# Patient Record
Sex: Male | Born: 1950 | Race: White | Hispanic: No | Smoking: Former smoker
Health system: Southern US, Community
[De-identification: ages and names within clinical notes are randomized; demographics above are authoritative.]

## PROBLEM LIST (undated history)

## (undated) DIAGNOSIS — F32A Depression, unspecified: Secondary | ICD-10-CM

## (undated) DIAGNOSIS — J449 Chronic obstructive pulmonary disease, unspecified: Secondary | ICD-10-CM

## (undated) DIAGNOSIS — F329 Major depressive disorder, single episode, unspecified: Secondary | ICD-10-CM

## (undated) DIAGNOSIS — F419 Anxiety disorder, unspecified: Secondary | ICD-10-CM

## (undated) HISTORY — PX: NASAL SEPTUM SURGERY: SHX37

## (undated) HISTORY — PX: REPLACEMENT TOTAL KNEE: SUR1224

---

## 1998-07-11 ENCOUNTER — Ambulatory Visit: Admission: RE | Admit: 1998-07-11 | Discharge: 1998-07-11 | Payer: Self-pay | Admitting: Internal Medicine

## 1998-08-30 ENCOUNTER — Ambulatory Visit: Admission: RE | Admit: 1998-08-30 | Discharge: 1998-08-30 | Payer: Self-pay | Admitting: Internal Medicine

## 1999-12-24 ENCOUNTER — Encounter: Payer: Self-pay | Admitting: *Deleted

## 1999-12-24 ENCOUNTER — Ambulatory Visit (HOSPITAL_COMMUNITY): Admission: RE | Admit: 1999-12-24 | Discharge: 1999-12-24 | Payer: Self-pay | Admitting: *Deleted

## 2000-01-28 ENCOUNTER — Ambulatory Visit (HOSPITAL_BASED_OUTPATIENT_CLINIC_OR_DEPARTMENT_OTHER): Admission: RE | Admit: 2000-01-28 | Discharge: 2000-01-29 | Payer: Self-pay | Admitting: Orthopedic Surgery

## 2000-11-29 ENCOUNTER — Encounter: Payer: Self-pay | Admitting: Emergency Medicine

## 2000-11-29 ENCOUNTER — Emergency Department (HOSPITAL_COMMUNITY): Admission: EM | Admit: 2000-11-29 | Discharge: 2000-11-29 | Payer: Self-pay | Admitting: Emergency Medicine

## 2001-02-25 ENCOUNTER — Ambulatory Visit (HOSPITAL_COMMUNITY): Admission: RE | Admit: 2001-02-25 | Discharge: 2001-02-25 | Payer: Self-pay | Admitting: Neurology

## 2003-12-28 ENCOUNTER — Inpatient Hospital Stay (HOSPITAL_COMMUNITY): Admission: EM | Admit: 2003-12-28 | Discharge: 2003-12-30 | Payer: Self-pay | Admitting: Emergency Medicine

## 2004-01-04 ENCOUNTER — Encounter: Admission: RE | Admit: 2004-01-04 | Discharge: 2004-01-04 | Payer: Self-pay | Admitting: Family Medicine

## 2004-01-14 ENCOUNTER — Encounter: Admission: RE | Admit: 2004-01-14 | Discharge: 2004-01-14 | Payer: Self-pay | Admitting: Family Medicine

## 2004-02-18 ENCOUNTER — Encounter: Admission: RE | Admit: 2004-02-18 | Discharge: 2004-02-18 | Payer: Self-pay | Admitting: Family Medicine

## 2004-02-19 ENCOUNTER — Inpatient Hospital Stay (HOSPITAL_COMMUNITY): Admission: EM | Admit: 2004-02-19 | Discharge: 2004-03-17 | Payer: Self-pay | Admitting: Emergency Medicine

## 2004-02-19 ENCOUNTER — Encounter: Payer: Self-pay | Admitting: Cardiovascular Disease

## 2004-03-17 ENCOUNTER — Inpatient Hospital Stay (HOSPITAL_COMMUNITY)
Admission: RE | Admit: 2004-03-17 | Discharge: 2004-03-21 | Payer: Self-pay | Admitting: Physical Medicine & Rehabilitation

## 2004-03-21 ENCOUNTER — Inpatient Hospital Stay (HOSPITAL_COMMUNITY): Admission: RE | Admit: 2004-03-21 | Discharge: 2004-04-02 | Payer: Self-pay | Admitting: Psychiatry

## 2004-04-03 ENCOUNTER — Other Ambulatory Visit (HOSPITAL_COMMUNITY): Admission: RE | Admit: 2004-04-03 | Discharge: 2004-05-05 | Payer: Self-pay | Admitting: Psychiatry

## 2004-04-07 ENCOUNTER — Encounter: Admission: RE | Admit: 2004-04-07 | Discharge: 2004-04-07 | Payer: Self-pay | Admitting: Family Medicine

## 2004-04-10 ENCOUNTER — Encounter: Admission: RE | Admit: 2004-04-10 | Discharge: 2004-04-10 | Payer: Self-pay | Admitting: Sports Medicine

## 2004-04-24 ENCOUNTER — Encounter: Admission: RE | Admit: 2004-04-24 | Discharge: 2004-04-24 | Payer: Self-pay | Admitting: Sports Medicine

## 2004-05-01 ENCOUNTER — Encounter: Admission: RE | Admit: 2004-05-01 | Discharge: 2004-05-22 | Payer: Self-pay | Admitting: Sports Medicine

## 2004-05-29 ENCOUNTER — Encounter: Admission: RE | Admit: 2004-05-29 | Discharge: 2004-05-29 | Payer: Self-pay | Admitting: Family Medicine

## 2004-05-30 ENCOUNTER — Encounter: Admission: RE | Admit: 2004-05-30 | Discharge: 2004-05-30 | Payer: Self-pay | Admitting: Family Medicine

## 2004-06-30 ENCOUNTER — Encounter: Admission: RE | Admit: 2004-06-30 | Discharge: 2004-06-30 | Payer: Self-pay | Admitting: Family Medicine

## 2004-07-23 ENCOUNTER — Encounter: Admission: RE | Admit: 2004-07-23 | Discharge: 2004-07-23 | Payer: Self-pay | Admitting: Family Medicine

## 2004-08-07 ENCOUNTER — Ambulatory Visit: Payer: Self-pay | Admitting: Family Medicine

## 2004-08-21 ENCOUNTER — Ambulatory Visit: Payer: Self-pay | Admitting: Sports Medicine

## 2004-09-04 ENCOUNTER — Ambulatory Visit: Payer: Self-pay | Admitting: Family Medicine

## 2005-01-21 ENCOUNTER — Ambulatory Visit: Payer: Self-pay | Admitting: Family Medicine

## 2005-02-25 ENCOUNTER — Ambulatory Visit: Payer: Self-pay | Admitting: Family Medicine

## 2005-03-16 ENCOUNTER — Ambulatory Visit: Payer: Self-pay | Admitting: Sports Medicine

## 2005-04-15 ENCOUNTER — Ambulatory Visit: Payer: Self-pay | Admitting: Family Medicine

## 2005-08-16 ENCOUNTER — Ambulatory Visit: Payer: Self-pay | Admitting: Family Medicine

## 2005-08-16 ENCOUNTER — Inpatient Hospital Stay (HOSPITAL_COMMUNITY): Admission: EM | Admit: 2005-08-16 | Discharge: 2005-08-17 | Payer: Self-pay | Admitting: *Deleted

## 2005-08-21 ENCOUNTER — Ambulatory Visit: Payer: Self-pay | Admitting: Family Medicine

## 2005-10-06 ENCOUNTER — Ambulatory Visit: Payer: Self-pay | Admitting: Family Medicine

## 2006-02-22 ENCOUNTER — Ambulatory Visit: Payer: Self-pay | Admitting: Sports Medicine

## 2006-08-12 ENCOUNTER — Ambulatory Visit: Payer: Self-pay | Admitting: Sports Medicine

## 2006-08-27 ENCOUNTER — Inpatient Hospital Stay (HOSPITAL_COMMUNITY): Admission: RE | Admit: 2006-08-27 | Discharge: 2006-09-02 | Payer: Self-pay | Admitting: Orthopedic Surgery

## 2006-10-06 ENCOUNTER — Ambulatory Visit: Payer: Self-pay | Admitting: Family Medicine

## 2006-10-27 ENCOUNTER — Emergency Department (HOSPITAL_COMMUNITY): Admission: EM | Admit: 2006-10-27 | Discharge: 2006-10-28 | Payer: Self-pay | Admitting: Emergency Medicine

## 2006-11-04 ENCOUNTER — Ambulatory Visit: Payer: Self-pay | Admitting: Family Medicine

## 2006-11-09 ENCOUNTER — Ambulatory Visit: Payer: Self-pay | Admitting: Sports Medicine

## 2006-12-02 ENCOUNTER — Ambulatory Visit: Payer: Self-pay | Admitting: Family Medicine

## 2006-12-02 ENCOUNTER — Encounter (INDEPENDENT_AMBULATORY_CARE_PROVIDER_SITE_OTHER): Payer: Self-pay | Admitting: Family Medicine

## 2006-12-02 LAB — CONVERTED CEMR LAB
ALT: 20 units/L (ref 0–53)
Alkaline Phosphatase: 105 units/L (ref 39–117)
Bilirubin, Direct: 0.1 mg/dL (ref 0.0–0.3)
Cholesterol: 321 mg/dL — ABNORMAL HIGH (ref 0–200)
Indirect Bilirubin: 0.3 mg/dL (ref 0.0–0.9)
Total Protein: 7.2 g/dL (ref 6.0–8.3)
Triglycerides: 332 mg/dL — ABNORMAL HIGH (ref <150)
VLDL: 66 mg/dL — ABNORMAL HIGH (ref 0–40)

## 2007-01-06 ENCOUNTER — Ambulatory Visit: Payer: Self-pay | Admitting: Family Medicine

## 2007-01-20 DIAGNOSIS — G4730 Sleep apnea, unspecified: Secondary | ICD-10-CM | POA: Insufficient documentation

## 2007-01-20 DIAGNOSIS — J4489 Other specified chronic obstructive pulmonary disease: Secondary | ICD-10-CM | POA: Insufficient documentation

## 2007-01-20 DIAGNOSIS — F411 Generalized anxiety disorder: Secondary | ICD-10-CM | POA: Insufficient documentation

## 2007-01-20 DIAGNOSIS — K219 Gastro-esophageal reflux disease without esophagitis: Secondary | ICD-10-CM

## 2007-01-20 DIAGNOSIS — J309 Allergic rhinitis, unspecified: Secondary | ICD-10-CM | POA: Insufficient documentation

## 2007-01-20 DIAGNOSIS — J449 Chronic obstructive pulmonary disease, unspecified: Secondary | ICD-10-CM

## 2007-01-20 DIAGNOSIS — I10 Essential (primary) hypertension: Secondary | ICD-10-CM

## 2007-01-20 DIAGNOSIS — F339 Major depressive disorder, recurrent, unspecified: Secondary | ICD-10-CM | POA: Insufficient documentation

## 2007-01-20 DIAGNOSIS — N400 Enlarged prostate without lower urinary tract symptoms: Secondary | ICD-10-CM | POA: Insufficient documentation

## 2007-01-20 DIAGNOSIS — E785 Hyperlipidemia, unspecified: Secondary | ICD-10-CM

## 2007-01-31 ENCOUNTER — Telehealth: Payer: Self-pay | Admitting: *Deleted

## 2007-02-01 ENCOUNTER — Ambulatory Visit: Payer: Self-pay | Admitting: Family Medicine

## 2007-02-02 ENCOUNTER — Telehealth: Payer: Self-pay | Admitting: *Deleted

## 2007-03-09 ENCOUNTER — Encounter (INDEPENDENT_AMBULATORY_CARE_PROVIDER_SITE_OTHER): Payer: Self-pay | Admitting: Family Medicine

## 2007-03-09 ENCOUNTER — Ambulatory Visit: Payer: Self-pay | Admitting: Family Medicine

## 2007-03-09 LAB — CONVERTED CEMR LAB
AST: 18 units/L (ref 0–37)
Albumin: 4.2 g/dL (ref 3.5–5.2)
Alkaline Phosphatase: 102 units/L (ref 39–117)
BUN: 30 mg/dL — ABNORMAL HIGH (ref 6–23)
Calcium: 9.5 mg/dL (ref 8.4–10.5)
Chloride: 97 meq/L (ref 96–112)
Creatinine, Ser: 1.22 mg/dL (ref 0.40–1.50)
Glucose, Bld: 103 mg/dL — ABNORMAL HIGH (ref 70–99)
HDL: 40 mg/dL (ref >39)
Potassium: 3.8 meq/L (ref 3.5–5.3)
Total CHOL/HDL Ratio: 4.5
Triglycerides: 271 mg/dL — ABNORMAL HIGH (ref <150)

## 2007-03-23 ENCOUNTER — Telehealth (INDEPENDENT_AMBULATORY_CARE_PROVIDER_SITE_OTHER): Payer: Self-pay | Admitting: Pharmacist

## 2007-05-31 ENCOUNTER — Telehealth (INDEPENDENT_AMBULATORY_CARE_PROVIDER_SITE_OTHER): Payer: Self-pay | Admitting: Family Medicine

## 2007-06-06 ENCOUNTER — Telehealth (INDEPENDENT_AMBULATORY_CARE_PROVIDER_SITE_OTHER): Payer: Self-pay | Admitting: Pharmacist

## 2007-06-07 ENCOUNTER — Ambulatory Visit: Payer: Self-pay | Admitting: Family Medicine

## 2007-07-08 ENCOUNTER — Encounter (INDEPENDENT_AMBULATORY_CARE_PROVIDER_SITE_OTHER): Payer: Self-pay | Admitting: Family Medicine

## 2007-07-08 ENCOUNTER — Ambulatory Visit: Payer: Self-pay | Admitting: Family Medicine

## 2008-11-02 ENCOUNTER — Encounter: Payer: Self-pay | Admitting: Emergency Medicine

## 2008-11-02 ENCOUNTER — Inpatient Hospital Stay (HOSPITAL_COMMUNITY): Admission: AD | Admit: 2008-11-02 | Discharge: 2008-11-06 | Payer: Self-pay | Admitting: Family Medicine

## 2008-11-02 ENCOUNTER — Ambulatory Visit: Payer: Self-pay | Admitting: Family Medicine

## 2008-11-02 ENCOUNTER — Encounter: Payer: Self-pay | Admitting: Family Medicine

## 2008-11-02 DIAGNOSIS — T50901A Poisoning by unspecified drugs, medicaments and biological substances, accidental (unintentional), initial encounter: Secondary | ICD-10-CM | POA: Insufficient documentation

## 2008-11-06 ENCOUNTER — Ambulatory Visit: Payer: Self-pay | Admitting: Psychiatry

## 2008-11-06 ENCOUNTER — Inpatient Hospital Stay (HOSPITAL_COMMUNITY): Admission: RE | Admit: 2008-11-06 | Discharge: 2008-11-14 | Payer: Self-pay | Admitting: Psychiatry

## 2008-11-19 ENCOUNTER — Other Ambulatory Visit (HOSPITAL_COMMUNITY): Admission: RE | Admit: 2008-11-19 | Discharge: 2008-12-04 | Payer: Self-pay | Admitting: Psychiatry

## 2008-11-20 ENCOUNTER — Ambulatory Visit: Payer: Self-pay | Admitting: Family Medicine

## 2008-11-20 ENCOUNTER — Encounter: Payer: Self-pay | Admitting: Family Medicine

## 2008-11-20 DIAGNOSIS — IMO0001 Reserved for inherently not codable concepts without codable children: Secondary | ICD-10-CM

## 2008-11-20 LAB — CONVERTED CEMR LAB
BUN: 15 mg/dL (ref 6–23)
CO2: 23 meq/L (ref 19–32)
Calcium: 8.9 mg/dL (ref 8.4–10.5)
Chloride: 104 meq/L (ref 96–112)
Creatinine, Ser: 1.02 mg/dL (ref 0.40–1.50)
Direct LDL: 191 mg/dL — ABNORMAL HIGH
PSA: 2.99 ng/mL (ref 0.10–4.00)
Total Bilirubin: 0.4 mg/dL (ref 0.3–1.2)

## 2008-12-06 ENCOUNTER — Telehealth: Payer: Self-pay | Admitting: Family Medicine

## 2009-07-17 ENCOUNTER — Telehealth (INDEPENDENT_AMBULATORY_CARE_PROVIDER_SITE_OTHER): Payer: Self-pay | Admitting: *Deleted

## 2009-08-02 ENCOUNTER — Ambulatory Visit: Payer: Self-pay | Admitting: Family Medicine

## 2009-08-02 DIAGNOSIS — F17200 Nicotine dependence, unspecified, uncomplicated: Secondary | ICD-10-CM

## 2009-11-05 ENCOUNTER — Telehealth (INDEPENDENT_AMBULATORY_CARE_PROVIDER_SITE_OTHER): Payer: Self-pay | Admitting: *Deleted

## 2009-11-05 ENCOUNTER — Ambulatory Visit: Payer: Self-pay | Admitting: Family Medicine

## 2009-11-06 ENCOUNTER — Encounter: Payer: Self-pay | Admitting: Family Medicine

## 2009-11-06 ENCOUNTER — Ambulatory Visit: Payer: Self-pay | Admitting: Family Medicine

## 2009-11-06 ENCOUNTER — Inpatient Hospital Stay (HOSPITAL_COMMUNITY): Admission: EM | Admit: 2009-11-06 | Discharge: 2009-11-08 | Payer: Self-pay | Admitting: Emergency Medicine

## 2009-11-06 ENCOUNTER — Encounter: Admission: RE | Admit: 2009-11-06 | Discharge: 2009-11-06 | Payer: Self-pay | Admitting: Family Medicine

## 2009-11-25 ENCOUNTER — Ambulatory Visit: Payer: Self-pay | Admitting: Family Medicine

## 2009-11-25 LAB — CONVERTED CEMR LAB: Rapid Strep: NEGATIVE

## 2010-10-15 ENCOUNTER — Encounter (INDEPENDENT_AMBULATORY_CARE_PROVIDER_SITE_OTHER): Payer: Self-pay | Admitting: *Deleted

## 2010-10-28 ENCOUNTER — Encounter: Payer: Self-pay | Admitting: Family Medicine

## 2010-12-23 NOTE — Assessment & Plan Note (Signed)
Summary: hfu,df   Vital Signs:  Patient profile:   60 year old male Height:      69 inches Weight:      186 pounds BMI:     27.57 Temp:     98.7 degrees F oral Pulse rate:   82 / minute BP sitting:   129 / 83  (left arm) Cuff size:   regular  Vitals Entered By: Tessie Fass CMA (November 25, 2009 2:14 PM) CC: hospital f/u pneumonia Is Patient Diabetic? No Pain Assessment Patient in pain? yes     Location: chest Intensity: 2   Primary Care Provider:  [name] MD  CC:  hospital f/u pneumonia.  History of Present Illness: Mr. [name] is here for hospital follow-up.  Was hospitalized for pneumonia about 2 weeks ago.  Treated with Avelox.  BP med also stopped due to low normal BPs.  Reports he is feeling better and better everyday though still not quite back to normal.  Still has annoying scrathcy cough and sore throat.  Worried he has strep throat.  No other complaints today.   Habits & Providers  Alcohol-Tobacco-Diet     Tobacco Status: quit  Allergies: No Known Drug Allergies  Social History: Smoking Status:  quit  Physical Exam  General:  alert, well-developed, well-nourished, and well-hydrated.   Mouth:  oropharynx mildly erythematous Lungs:  normal work of breathing, slightly decreased BS on R base.  No wheezes, crackles. Heart:  RRR without murmur   Impression & Recommendations:  Problem # 1:  PNEUMONIA (ICD-486) Assessment Improved  Clinically resolving.  Follow-up in 1 month for repeat CXR to eval for resolution.  Completed  course of avelox.   Orders: [facility]- Est Level  3 ([id number])  Problem # 2:  HYPERTENSION, BENIGN SYSTEMIC (ICD-401.1) Assessment: Improved  Currently off lisinopril and BP normal.  Will recheck in 1 month.  His updated medication list for this problem includes:    Lisinopril 40 Mg Tabs (Lisinopril) .Marland Kitchen... 1 tab by mouth daily  Orders: [facility]- Est Level  3 ([id number])  Complete Medication List: 1)  Clonazepam 1 Mg Tabs  (Clonazepam) .... Take as directed 2)  Aspir-low 81 Mg Tbec (Aspirin) .Marland Kitchen.. 1 daily 3)  Loratadine 10 Mg Tabs (Loratadine) .Marland Kitchen.. 1 tab by mouth daily prn 4)  Combivent 103-18 Mcg/act Aero (Ipratropium-albuterol) .... 2 puffs inhaled twice a day 5)  Lisinopril 40 Mg Tabs (Lisinopril) .Marland Kitchen.. 1 tab by mouth daily 6)  Lithium Carbonate 300 Mg Caps (Lithium carbonate) .Marland Kitchen.. 1 cap by mouth two times a day 7)  Remeron 45 Mg Tabs (Mirtazapine) .Marland Kitchen.. 1 tab by mouth at bedtime 8)  Fluoxetine Hcl 20 Mg Tabs (Fluoxetine hcl) .Marland Kitchen.. 1 tab by mouth daily 9)  Tramadol Hcl 50 Mg Tabs (Tramadol hcl) .Marland Kitchen.. 1-2 tabs by mouth every 6 hours as needed for pain 10)  Pravastatin Sodium 20 Mg Tabs (Pravastatin sodium) .Marland Kitchen.. 1 tab by mouth at bedtime 11)  Chantix Starting Month Pak 0.5 Mg X 11 & 1 Mg X 42 Tabs (Varenicline tartrate) .... As directed - dispense one month supply 12)  Chantix 1 Mg Tabs (Varenicline tartrate) .... One twice daily with meals  Other Orders: Rapid Strep-[facility] ([id number])  Laboratory Results  Date/Time Received: November 25, 2009 3:23 PM  Date/Time Reported: November 25, 2009 3:43 PM   Other Tests  Rapid Strep: negative Comments: ...............test performed by......Marland KitchenBonnie A. [place], MLS (ASCP)cm

## 2010-12-23 NOTE — Miscellaneous (Signed)
  Clinical Lists Changes  Problems: Changed problem from APNEA, SLEEP - ON CPAP (ICD-780.57) to SLEEP APNEA (ICD-780.57) Removed problem of PNEUMONIA (ICD-486) Removed problem of SPECIAL SCREENING MALIGNANT NEOPLASM OF PROSTATE (ICD-V76.44)      Allergies: No Known Drug Allergies

## 2010-12-23 NOTE — Miscellaneous (Signed)
Summary: med. re request  Clinical Lists Changes   Recieved med record request: [employer]  Date sent- 10/10/2010  Marily Memos  October 15, 2010 1:41 PM

## 2011-01-01 ENCOUNTER — Telehealth: Payer: Self-pay | Admitting: *Deleted

## 2011-01-01 NOTE — Telephone Encounter (Signed)
Message copied by Arlyss Repress on Thu Jan 01, 2011  2:42 PM ------      Message from: [place], [place]      Created: Thu Jan 01, 2011  2:24 PM      Regarding: needs appt prior to refill       Pt needs an appt prior to giving refills has not been seen at clinic in over 1 year.  CAVINESS,DAWN

## 2011-02-23 LAB — EXPECTORATED SPUTUM ASSESSMENT W GRAM STAIN, RFLX TO RESP C

## 2011-02-23 LAB — BASIC METABOLIC PANEL
CO2: 28 mEq/L (ref 19–32)
Calcium: 8 mg/dL — ABNORMAL LOW (ref 8.4–10.5)
Chloride: 106 mEq/L (ref 96–112)
Creatinine, Ser: 0.99 mg/dL (ref 0.4–1.5)
GFR calc non Af Amer: >60 mL/min (ref >60)
Glucose, Bld: 93 mg/dL (ref 70–99)
Potassium: 4.1 mEq/L (ref 3.5–5.1)
Sodium: 135 mEq/L (ref 135–145)

## 2011-02-23 LAB — CBC
HCT: 38.7 % — ABNORMAL LOW (ref 39.0–52.0)
MCHC: 33.6 g/dL (ref 30.0–36.0)
MCHC: 34.3 g/dL (ref 30.0–36.0)
MCV: 87.1 fL (ref 78.0–100.0)
Platelets: 309 10*3/uL (ref 150–400)
RBC: 4.45 MIL/uL (ref 4.22–5.81)
RBC: 4.81 MIL/uL (ref 4.22–5.81)
WBC: 11.4 10*3/uL — ABNORMAL HIGH (ref 4.0–10.5)
WBC: 13.5 10*3/uL — ABNORMAL HIGH (ref 4.0–10.5)

## 2011-02-23 LAB — CULTURE, RESPIRATORY W GRAM STAIN

## 2011-02-24 LAB — BASIC METABOLIC PANEL
CO2: 24 mEq/L (ref 19–32)
Calcium: 8.7 mg/dL (ref 8.4–10.5)
Creatinine, Ser: 1.1 mg/dL (ref 0.4–1.5)
Glucose, Bld: 76 mg/dL (ref 70–99)

## 2011-02-24 LAB — CBC
MCHC: 34 g/dL (ref 30.0–36.0)
MCV: 86.7 fL (ref 78.0–100.0)
Platelets: 331 10*3/uL (ref 150–400)

## 2011-02-24 LAB — DIFFERENTIAL
Basophils Absolute: 0.1 10*3/uL (ref 0.0–0.1)
Basophils Relative: 1 % (ref 0–1)
Eosinophils Absolute: 0.7 10*3/uL (ref 0.0–0.7)
Neutrophils Relative %: 67 % (ref 43–77)

## 2011-02-24 LAB — CULTURE, BLOOD (ROUTINE X 2)

## 2011-04-07 NOTE — Discharge Summary (Signed)
[name], [name] NO.:  [id number]   MEDICAL RECORD NO.:  [id number]          PATIENT TYPE:  INP   LOCATION:  3017                         FACILITY:  [facility]   PHYSICIAN:  Dylan Ramp, MD        DATE OF BIRTH:  11-Mar-1951   DATE OF ADMISSION:  11/02/2008  DATE OF DISCHARGE:  11/05/2008                               DISCHARGE SUMMARY   PRIMARY CARE [name]:  [name], M.D. at [facility] who the patient has not seen in over for year.   DISCHARGE DIAGNOSES:  1. Intentional drug overdose  2. Hypertension.  3. Hyperlipidemia.  4. History of chronic obstructive pulmonary disease and sleep apnea      previously on the continuous positive airway pressure machine at      home, currently not requiring treatment.  5. History of major depression.  6. History of anxiety.  7. History of gastroesophageal reflux disease.  8. History of migraine.  9. History of allergic rhinitis.  10.History of benign prostatic hypertrophy.  11.History of knee injury.   PROCEDURES:  1. Portable chest x-ray on November 02, 2008 showed ill-defined left      lateral air space disease likely representing atelectasis,      borderline cardiomegaly without failure and scoliosis.  No acute      cardiopulmonary disease.  2. CT of head without contrast on November 02, 2008 showed no acute      intracranial abnormality and punctate lacunar infarct at the      anterior limb of the left internal capsule which was not present in      2005, but does not appear acute.   CONSULTATIONS:  [name], M.D. with psychiatry was consulted on  November 05, 2008.  Once the patient awoke he diagnosed the patient with  a mood disorder NOS and recommended transfer to inpatient psychiatric  ward as soon as possible.  See his STAT dictation number [id number] for  further details.   BRIEF HOSPITAL COURSE:  This is a 60 year old white male who has not  been seen in our office in August of  2008 with a history of depression  and anxiety who presented to the [facility] ED on the morning of  November 02, 2008 after overdosing on 44 Klonopin, approximately 10  Vicodin and approximately a dozen Abilify and Effexor on the night of  November 01, 2008 around 11 p.m.  He had a lengthy suicide note which he  had sent to his lawyer, who promptly called the police and sent them to  his house who discovered the patient and called 9-1-1.  The patient was  thus brought to [facility] ED by EMS.  The patient was transferred from  [facility] ED to [facility] for admission given the family  practice is his primary care doctor.  The patient arrived to [facility] approximately at 7 p.m. on November 02, 2008.   1. Intentional overdose:  The patient intended to take his life with      his essential overdose and had  a suicide note which he left was      very extensive detailing his will, as well as confessions.  The      patient will upon arrival to [facility] on November 02, 2008 was      minimally responsive even to painful stimuli.  His blood gas at      that time showed an acidotic pH with CO2 retention, as well as an      elevated bicarb as the patient was placed on BiPAP and remained on      BiPAP until early in the morning of discharge.  The patient was      given supportive care for his multidrug overdose and poison control      was contacted multiple times during admission and updated on the      patient's progress.  Particularly, the patient was monitored for      respiratory status given the overdose of benzodiazepines and      opiates, as well as monitored for cardiac and seizure sequelae of      the overdose on Effexor and Abilify.  The patient's vital signs      remained fairly stable throughout hospitalization.  He initially      had a slightly prolonged QTC on EKG in the 470s however, this      normalized by the time of discharge to approximately 440.  The       patient never had a prolonged QRS interval.  The patient also never      showed any evidence of seizure activity.  After awakening on the      morning of November 05, 2008 a repeat blood gas was obtained off of      BiPAP and indicated the patient was adequately compensating on room      air with a pH of 7.446, pCO2 of 43, pO2 of 66 and bicarb of 29 and      oxygen saturation of 94.9% on room air.  At the time of awakening      the patient did confirm that he intended to take his life and still      would like to do so.  He agrees to inpatient psych admissions for      further help.  His previous home psych meds included Klonopin 1 mg      p.o. b.i.d., Effexor 150 mg p.o. b.i.d., Abilify unknown dose and      Seroquel 200 mg p.o. nightly.  All of these meds have been held at      this time and further treatment of the patient's mood disorder will      be left to the discretion of behavioral health in the inpatient      setting.  2. History of hypertension:  The patient was initially normal to      mildly hypotensive on admission and bradycardic.  Thus no blood      pressure medicines were initiated.  He did require one dose of      p.r.n. hydralazine for elevated blood pressure thus, he will be      restarted on his home blood pressure medicine of      hydrochlorothiazide 25 mg p.o. daily at the time of discharge.  3. Hyperlipidemia:  The patient had not had a fasting lipid panel      checked in some time as he has not been seen in our office for more  than 1 year.  He reports being unable to afford his previous      statin.  Thus we will hold on restarting this medication at this      time as acute psychiatric treatment is more urgent.  We will leave      further management of this to his primary care doctor in the      outpatient setting who he will need to see within a couple weeks of      discharge from behavioral health.  4. History of COPD and sleep apnea:  The patient  required no breathing      treatments during hospitalization.  Should he have problems with      this at behavioral health we are recommending resuming CPAP      nightly.  5. History of esophageal reflux disease:  We recommend the patient      take Prilosec OTC 20 mg p.o. daily.  6. History of migraine:  The patient did not require any treatment for      this and was not on any medications for this the last time he was      seen in our office  7. History of tobacco abuse:  The patient was not treated with      nicotine patch during this hospitalization given his acute      condition related to his overdose however, would consider starting      the patient if he has cravings while at behavioral health.   PERTINENT LABORATORY DATA:  On the day of discharge the patient's basic  metabolic panel revealed a sodium of 138, potassium of 3.6, chloride of  103, bicarb of 31, glucose of 101, BUN of 4, creatinine of 0.83 and  calcium of 8.7.  Magnesium was normal at 2.  ABG was as stated above.  Cardiac panel was normal with a CK of 116, CK-MB of 0.5 and troponin of  0.01.  CBC at the time of admission revealed a white blood cell count of  11.3, hemoglobin of 16 and platelets of 234.  Urine drug screen on  admission was positive only for benzodiazepines and opiates consistent  with the patient's known overdose.  Acetaminophen and salicylate levels  were normal.  Alcohol level was less than 5 and urine tricyclic screen  was negative.   DISCHARGE MEDICATIONS:  1. Hydrochlorothiazide 25 mg p.o. daily, this is being resumed at the      time of discharge and the patient will need lab follow-up in      approximately 2 weeks with his primary care physician.  2. Prilosec OTC 20 mg p.o. daily.  3. Aspirin 81 mg p.o. daily.   Previous home medications which were not initiated the time of discharge  include:  1. Albuterol 90 mcg inhaler 2 puffs every 4 hours as needed.  2. Combivent 103/18 mcg 2 puffs  using inhaler four times a day.  3. Psych meds as listed above.  4. Crestor 40 mg p.o. daily.  5. Nasonex 50 mcg 2 sprays each nostril daily.  6. Patanol eye drops.   Many these medications had been discontinued due to the patient's  financial straights as his psychiatric medications are most important at  this period of time.  These medicines can be gradually restarted by his  primary care physician in the outpatient setting.   DISCHARGE INSTRUCTIONS:  The patient is to be discharged to behavioral  health inpatient psychiatric facility for further treatment of  his acute  suicidal ideations and recent overdose attempt.  The patient is to  remain on suicide precautions and is to follow a heart healthy diet.  He  may resume activity as tolerated.   CONDITION ON DISCHARGE:  The patient is medically stable for discharge  to acute inpatient psychiatric facility.  He remains actively suicidal  at the time of discharge.   FOLLOW-UP APPOINTMENTS:  The patient will need a follow-up appointment  with [name], M.D. at [facility] phone  number [phone] in approximately 2 weeks from the time of discharge for  [facility].      Drue Dun, M.D.  Electronically Signed      Dylan Ramp, MD  Electronically Signed    EE/MEDQ  D:  11/05/2008  T:  11/05/2008  Job:  [id number]
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
## 2011-04-10 NOTE — Op Note (Signed)
[name], [name]              ACCOUNT NO.:  [id number]   MEDICAL RECORD NO.:  [id number]          PATIENT TYPE:  INP   LOCATION:  2550                         FACILITY:  [facility]   PHYSICIAN:  Harvie Junior, M.D.   DATE OF BIRTH:  10-12-1951   DATE OF PROCEDURE:  08/27/2006  DATE OF DISCHARGE:                                 OPERATIVE REPORT   PREOPERATIVE DIAGNOSES:  End-stage degenerative joint disease, left knee,  status post previous anterior cruciate ligament reconstruction.   POSTOPERATIVE DIAGNOSES:  End-stage degenerative joint disease, left knee,  status post previous anterior cruciate ligament reconstruction.   PRINCIPLE PROCEDURES:  1. Left total knee replacement with a [employer] system, size      4 femur, size 4 tibia, 10-mm bridging bearing, 35-mm all-polyethylene      patella.  2. Computer-assisted left total knee replacement.  3. Lateral retinacular release, left knee.  4. Removal of deep femoral hardware.  5. Removal of deep tibial hardware.   SURGEON:  Harvie Junior, M.D.   ASSISTANT:  Marshia Ly, P.A.   ANESTHESIA:  General.   BRIEF HISTORY:  Mr. Switalski is a 60 year old male with a long history of  having had an unstable left knee.  ACL reconstruction was performed many  years ago.  He did well postoperatively, but began having increasing  degenerative pain.  He was evaluated and noted to have bone-on-bone changes  in the lateral compartment.  We treated him conservatively with anti-  inflammatory medication and activity modification.  Because of the failure  of all conservative care, he was ultimately taken to the operating room for  left total knee replacement.  The patient was known to have ACL screws in  place, and these are going to need to be removed intraoperatively.   PROCEDURES:  The patient was taken to the operating room.  After adequate  anesthesia was obtained with a general anesthetic, the patient was placed on  the  operating table.  The left leg was prepped and draped in the usual  sterile fashion.  Following this, a midline incision was made.  Subcutaneous  tissues were dissected down to the level of the extensor mechanism.  A  medial parapatellar arthrotomy was undertaken.  The retropatellar fat pad,  medial and lateral meniscus, and anterior and posterior cruciate were then  excised.  At this point, the computer stenotic module was brought in.  Given  the patient's young age and need for longterm survival of components, we  felt that computer assistance was appropriate.  We discussed this  preoperatively and brought the computer in at this point.  We put the pins  in at this point, 2 medial pins through percutaneous stab holes, 2 femoral  pins within the wound; and this ultimately then was set up.  It takes about  20 minutes to register the patient, find the center of the femoral head and  to align that over the center of the distal tibia.  At this point, once the  computer registration was undertaken, the tibia was cut perpendicular to its  long  axis.  The attention was then turned towards the distal femur, which  was cut perpendicular to its long axis after tensioning was used.  The  computer originally wanted to size to a 3; we knew a 4 was a better fit.  So, we had to mentally make some adjustments and went back and cut more  tibia to open up the flexion and extension gap, and then cut less on the  distal femur.  This was all made; mental adjustments were made, and then the  tibia was recut.  We retensioned at that point just to set the plan , and  that actually did set the plan  perfectly, as we had anticipated.  At that  point, the plan was outlined and the distal femur was then cut perpendicular  to the long axis.  Once the distal femur had been cut, the rotational  alignment was checked with the computer assistance again, and once this was  placed, then the 4-in-1 block was used.  Anterior  and posterior cuts were  made, as well as chamfer cuts.  A perfect anterior cut was made at this  point.  The box was then cut, and once the box was cut, the femoral  component was put in place.  Attention was turned to the tibia.  It was  sized to a 4.  This was rotationally aligned, and then central pegs were  drilled, as well as the keel, and excellent alignment was achieved with the  tibia.  At this point, a 10-mm bridging bearing was put in place.  Excellent  full extension was achieved.  The computer was used to test the long  alignment.  There was perfect neutral long alignment with no abnormalities.  At this point, the patellar trial was put in and we could see the patella  was tracking laterally.  Given that we had taken him from about 10 degrees  of valgus to a straight knee, I knew that this was potentially an issue; and  at this point, a lateral retinacular release was performed from the joint  line up into the muscle laterally.  This improved the patellar tracking to  perfect midline.  At this point, the trial components were removed.  The  final components (size 4 femur, size 4 tibia, 32-mm all-poly patella) were  cemented into place, and the knee was then put through a range of motion.  Excellent range was achieved, and excellent long alignment, giving perfect  neutral long alignment.  At this point, the computer modular was now removed  while the cement was hardening.  Once the cement was allowed to harden, we  put the knee through a range of motion.  Again, the lateral retinacular  release had been an excellent choice and the patella now tracked midline.  There was no restriction towards motion.  At this point, the medium Hemovac  drain was placed and the trial polyethylene was removed.  The tourniquet was  let down and all bleeding was controlled with electrocautery.  There was no  significant bleeding; and particularly, we looked over where the lateral retinacular release  was performed, and no significant bleeding on that side.  At this point, the final polyethylene component was put in place.  A final  check was made to make sure there were no loose fragments or pieces of  cement.  Seeing none, the medial parapatellar arthrotomy was closed with a 1  Vicryl running suture.  The skin was then closed  with 0 and 2-0 Vicryl and  skin staples.  A sterile compressive dressing was applied, and the patient  was taken to the recovery room, where he was noted to be in satisfactory  condition.  The estimated blood loss for the procedure was none.      Harvie Junior, M.D.  Electronically Signed     JLG/MEDQ  D:  08/27/2006  T:  08/27/2006  Job:  [id number]
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
## 2011-08-28 LAB — COMPREHENSIVE METABOLIC PANEL
ALT: 13 U/L (ref 0–53)
ALT: 14 U/L (ref 0–53)
AST: 22 U/L (ref 0–37)
AST: 16 U/L (ref 0–37)
Albumin: 2.7 g/dL — ABNORMAL LOW (ref 3.5–5.2)
Albumin: 2.9 g/dL — ABNORMAL LOW (ref 3.5–5.2)
Alkaline Phosphatase: 72 U/L (ref 39–117)
Alkaline Phosphatase: 75 U/L (ref 39–117)
BUN: 14 mg/dL (ref 6–23)
BUN: 15 mg/dL (ref 6–23)
BUN: 4 mg/dL — ABNORMAL LOW (ref 6–23)
CO2: 29 mEq/L (ref 19–32)
Calcium: 8.8 mg/dL (ref 8.4–10.5)
Chloride: 106 mEq/L (ref 96–112)
Chloride: 101 mEq/L (ref 96–112)
Chloride: 109 mEq/L (ref 96–112)
Creatinine, Ser: 1.05 mg/dL (ref 0.4–1.5)
GFR calc Af Amer: >60 mL/min (ref >60)
GFR calc Af Amer: >60 mL/min (ref >60)
GFR calc non Af Amer: >60 mL/min (ref >60)
Glucose, Bld: 128 mg/dL — ABNORMAL HIGH (ref 70–99)
Glucose, Bld: 91 mg/dL (ref 70–99)
Potassium: 3.7 mEq/L (ref 3.5–5.1)
Potassium: 4.4 mEq/L (ref 3.5–5.1)
Sodium: 136 mEq/L (ref 135–145)
Sodium: 142 mEq/L (ref 135–145)
Total Bilirubin: 0.5 mg/dL (ref 0.3–1.2)
Total Bilirubin: 0.3 mg/dL (ref 0.3–1.2)
Total Bilirubin: 0.5 mg/dL (ref 0.3–1.2)
Total Protein: 5.1 g/dL — ABNORMAL LOW (ref 6.0–8.3)
Total Protein: 5.2 g/dL — ABNORMAL LOW (ref 6.0–8.3)

## 2011-08-28 LAB — BLOOD GAS, ARTERIAL
Acid-Base Excess: 3.2 mmol/L — ABNORMAL HIGH (ref 0.0–2.0)
Acid-Base Excess: 3.3 mmol/L — ABNORMAL HIGH (ref 0.0–2.0)
Acid-Base Excess: 3.4 mmol/L — ABNORMAL HIGH (ref 0.0–2.0)
Acid-Base Excess: 4.3 mmol/L — ABNORMAL HIGH (ref 0.0–2.0)
Bicarbonate: 28.8 mEq/L — ABNORMAL HIGH (ref 20.0–24.0)
Bicarbonate: 29.4 mEq/L — ABNORMAL HIGH (ref 20.0–24.0)
Bicarbonate: 29.5 mEq/L — ABNORMAL HIGH (ref 20.0–24.0)
Bicarbonate: 31.8 mEq/L — ABNORMAL HIGH (ref 20.0–24.0)
Delivery systems: POSITIVE
Delivery systems: POSITIVE
Delivery systems: POSITIVE
Drawn by: 24610
FIO2: 0.3 %
FIO2: 0.4 %
FIO2: 0.4 %
Mode: POSITIVE
Mode: POSITIVE
O2 Content: 5 L/min
O2 Saturation: 97.3 %
O2 Saturation: 97.5 %
O2 Saturation: 98.4 %
O2 Saturation: 98.5 %
PEEP: 12 cmH2O
PEEP: 5 cmH2O
Patient temperature: 97.3
Patient temperature: 97.3
Patient temperature: 98.6
Patient temperature: 98.6
Pressure control: 16 cmH2O
Pressure support: 10 cmH2O
Pressure support: 10 cmH2O
Pressure support: 12 cmH2O
RATE: 10 resp/min
RATE: 12 resp/min
TCO2: 30.5 mmol/L (ref 0–100)
TCO2: 30.7 mmol/L (ref 0–100)
TCO2: 31.3 mmol/L (ref 0–100)
TCO2: 31.5 mmol/L (ref 0–100)
TCO2: 33.5 mmol/L (ref 0–100)
pCO2 arterial: 43.4 mmHg (ref 35.0–45.0)
pCO2 arterial: 56.7 mmHg — ABNORMAL HIGH (ref 35.0–45.0)
pCO2 arterial: 57.3 mmHg (ref 35.0–45.0)
pH, Arterial: 7.321 — ABNORMAL LOW (ref 7.350–7.450)
pH, Arterial: 7.323 — ABNORMAL LOW (ref 7.350–7.450)
pH, Arterial: 7.367 (ref 7.350–7.450)
pH, Arterial: 7.446 (ref 7.350–7.450)
pO2, Arterial: 116 mmHg — ABNORMAL HIGH (ref 80.0–100.0)
pO2, Arterial: 99 mmHg (ref 80.0–100.0)

## 2011-08-28 LAB — BASIC METABOLIC PANEL
BUN: 11 mg/dL (ref 6–23)
BUN: 12 mg/dL (ref 6–23)
BUN: 8 mg/dL (ref 6–23)
BUN: 9 mg/dL (ref 6–23)
BUN: 9 mg/dL (ref 6–23)
CO2: 27 mEq/L (ref 19–32)
CO2: 29 mEq/L (ref 19–32)
CO2: 30 mEq/L (ref 19–32)
CO2: 30 mEq/L (ref 19–32)
CO2: 31 mEq/L (ref 19–32)
Calcium: 9.3 mg/dL (ref 8.4–10.5)
Calcium: 8.5 mg/dL (ref 8.4–10.5)
Calcium: 8.5 mg/dL (ref 8.4–10.5)
Calcium: 8.7 mg/dL (ref 8.4–10.5)
Calcium: 8.8 mg/dL (ref 8.4–10.5)
Chloride: 102 mEq/L (ref 96–112)
Chloride: 104 mEq/L (ref 96–112)
Chloride: 104 mEq/L (ref 96–112)
Chloride: 105 mEq/L (ref 96–112)
Chloride: 105 mEq/L (ref 96–112)
Chloride: 99 mEq/L (ref 96–112)
Creatinine, Ser: 1.42 mg/dL (ref 0.4–1.5)
Creatinine, Ser: 0.8 mg/dL (ref 0.4–1.5)
Creatinine, Ser: 0.81 mg/dL (ref 0.4–1.5)
Creatinine, Ser: 0.88 mg/dL (ref 0.4–1.5)
Creatinine, Ser: 0.99 mg/dL (ref 0.4–1.5)
GFR calc Af Amer: >60 mL/min (ref >60)
GFR calc Af Amer: >60 mL/min (ref >60)
GFR calc Af Amer: >60 mL/min (ref >60)
GFR calc Af Amer: >60 mL/min (ref >60)
GFR calc Af Amer: >60 mL/min (ref >60)
GFR calc Af Amer: >60 mL/min (ref >60)
GFR calc non Af Amer: >60 mL/min (ref >60)
GFR calc non Af Amer: >60 mL/min (ref >60)
GFR calc non Af Amer: >60 mL/min (ref >60)
Glucose, Bld: 106 mg/dL — ABNORMAL HIGH (ref 70–99)
Glucose, Bld: 81 mg/dL (ref 70–99)
Glucose, Bld: 99 mg/dL (ref 70–99)
Potassium: 3.6 mEq/L (ref 3.5–5.1)
Potassium: 3.6 mEq/L (ref 3.5–5.1)
Potassium: 4.2 mEq/L (ref 3.5–5.1)
Sodium: 135 mEq/L (ref 135–145)
Sodium: 138 mEq/L (ref 135–145)
Sodium: 140 mEq/L (ref 135–145)

## 2011-08-28 LAB — DIFFERENTIAL
Basophils Absolute: 0.1 10*3/uL (ref 0.0–0.1)
Basophils Relative: 0 % (ref 0–1)
Eosinophils Absolute: 0 10*3/uL (ref 0.0–0.7)
Monocytes Absolute: 0.6 10*3/uL (ref 0.1–1.0)
Neutro Abs: 8.9 10*3/uL — ABNORMAL HIGH (ref 1.7–7.7)
Neutrophils Relative %: 79 % — ABNORMAL HIGH (ref 43–77)

## 2011-08-28 LAB — GLUCOSE, CAPILLARY
Glucose-Capillary: 109 mg/dL — ABNORMAL HIGH (ref 70–99)
Glucose-Capillary: 114 mg/dL — ABNORMAL HIGH (ref 70–99)
Glucose-Capillary: 132 mg/dL — ABNORMAL HIGH (ref 70–99)
Glucose-Capillary: 88 mg/dL (ref 70–99)

## 2011-08-28 LAB — PSA: PSA: 3.01 ng/mL (ref 0.10–4.00)

## 2011-08-28 LAB — MAGNESIUM
Magnesium: 1.9 mg/dL (ref 1.5–2.5)
Magnesium: 2 mg/dL (ref 1.5–2.5)
Magnesium: 2.3 mg/dL (ref 1.5–2.5)
Magnesium: 2.4 mg/dL (ref 1.5–2.5)

## 2011-08-28 LAB — ETHANOL: Alcohol, Ethyl (B): <5 mg/dL (ref 0–10)

## 2011-08-28 LAB — CARDIAC PANEL(CRET KIN+CKTOT+MB+TROPI)
CK, MB: 0.5 ng/mL (ref 0.3–4.0)
CK, MB: 1.1 ng/mL (ref 0.3–4.0)
CK, MB: 1.4 ng/mL (ref 0.3–4.0)
Relative Index: 0.4 (ref 0.0–2.5)
Relative Index: 0.4 (ref 0.0–2.5)
Relative Index: 0.9 (ref 0.0–2.5)
Total CK: 159 U/L (ref 7–232)
Total CK: 191 U/L (ref 7–232)
Total CK: 273 U/L — ABNORMAL HIGH (ref 7–232)
Total CK: 45 U/L (ref 7–232)
Troponin I: 0.01 ng/mL (ref 0.00–0.06)
Troponin I: 0.01 ng/mL (ref 0.00–0.06)
Troponin I: <0.01 ng/mL (ref 0.00–0.06)

## 2011-08-28 LAB — LITHIUM LEVEL: Lithium Lvl: 0.3 mEq/L — ABNORMAL LOW (ref 0.80–1.40)

## 2011-08-28 LAB — URINALYSIS, ROUTINE W REFLEX MICROSCOPIC
Nitrite: NEGATIVE
Protein, ur: NEGATIVE mg/dL
Specific Gravity, Urine: 1.039 — ABNORMAL HIGH (ref 1.005–1.030)
Urobilinogen, UA: 0.2 mg/dL (ref 0.0–1.0)

## 2011-08-28 LAB — POCT I-STAT 3, ART BLOOD GAS (G3+)
Acid-Base Excess: 2 mmol/L (ref 0.0–2.0)
Bicarbonate: 29.6 mEq/L — ABNORMAL HIGH (ref 20.0–24.0)
Bicarbonate: 30 mEq/L — ABNORMAL HIGH (ref 20.0–24.0)
O2 Saturation: 94 %
O2 Saturation: 98 %
Patient temperature: 97.5
TCO2: 31 mmol/L (ref 0–100)
TCO2: 31 mmol/L (ref 0–100)
pCO2 arterial: 41 mmHg (ref 35.0–45.0)
pO2, Arterial: 96 mmHg (ref 80.0–100.0)

## 2011-08-28 LAB — RAPID URINE DRUG SCREEN, HOSP PERFORMED
Opiates: POSITIVE — AB
Tetrahydrocannabinol: NOT DETECTED

## 2011-08-28 LAB — CK TOTAL AND CKMB (NOT AT ARMC): Relative Index: INVALID (ref 0.0–2.5)

## 2011-08-28 LAB — RPR: RPR Ser Ql: NONREACTIVE

## 2011-08-28 LAB — FOLATE RBC: RBC Folate: 488 ng/mL (ref 180–600)

## 2011-08-28 LAB — TROPONIN I: Troponin I: 0.01 ng/mL (ref 0.00–0.06)

## 2011-08-28 LAB — TESTOSTERONE, FREE

## 2011-08-28 LAB — VITAMIN D 1,25 DIHYDROXY
Vitamin D 1, 25 (OH)2 Total: 36 pg/mL (ref 18–72)
Vitamin D2 1, 25 (OH)2: <8 pg/mL
Vitamin D3 1, 25 (OH)2: 36 pg/mL

## 2011-08-28 LAB — CBC
MCHC: 33.5 g/dL (ref 30.0–36.0)
RDW: 13 % (ref 11.5–15.5)

## 2011-08-28 LAB — SALICYLATE LEVEL: Salicylate Lvl: <4 mg/dL (ref 2.8–20.0)

## 2011-08-28 LAB — ACETAMINOPHEN LEVEL: Acetaminophen (Tylenol), Serum: 15.3 ug/mL (ref 10–30)

## 2014-12-02 ENCOUNTER — Emergency Department (HOSPITAL_COMMUNITY): Payer: Medicare Other

## 2014-12-02 ENCOUNTER — Emergency Department (HOSPITAL_COMMUNITY)
Admission: EM | Admit: 2014-12-02 | Discharge: 2014-12-03 | Payer: Medicare Other | Attending: Emergency Medicine | Admitting: Emergency Medicine

## 2014-12-02 ENCOUNTER — Encounter (HOSPITAL_COMMUNITY): Payer: Self-pay

## 2014-12-02 DIAGNOSIS — Z87891 Personal history of nicotine dependence: Secondary | ICD-10-CM | POA: Diagnosis not present

## 2014-12-02 DIAGNOSIS — Y998 Other external cause status: Secondary | ICD-10-CM | POA: Insufficient documentation

## 2014-12-02 DIAGNOSIS — R55 Syncope and collapse: Secondary | ICD-10-CM | POA: Diagnosis present

## 2014-12-02 DIAGNOSIS — Y9301 Activity, walking, marching and hiking: Secondary | ICD-10-CM | POA: Diagnosis not present

## 2014-12-02 DIAGNOSIS — J90 Pleural effusion, not elsewhere classified: Secondary | ICD-10-CM | POA: Diagnosis not present

## 2014-12-02 DIAGNOSIS — F32A Depression, unspecified: Secondary | ICD-10-CM

## 2014-12-02 DIAGNOSIS — J449 Chronic obstructive pulmonary disease, unspecified: Secondary | ICD-10-CM | POA: Insufficient documentation

## 2014-12-02 DIAGNOSIS — S299XXA Unspecified injury of thorax, initial encounter: Secondary | ICD-10-CM | POA: Insufficient documentation

## 2014-12-02 DIAGNOSIS — S0121XA Laceration without foreign body of nose, initial encounter: Secondary | ICD-10-CM | POA: Insufficient documentation

## 2014-12-02 DIAGNOSIS — R45851 Suicidal ideations: Secondary | ICD-10-CM | POA: Diagnosis not present

## 2014-12-02 DIAGNOSIS — W01198A Fall on same level from slipping, tripping and stumbling with subsequent striking against other object, initial encounter: Secondary | ICD-10-CM | POA: Diagnosis not present

## 2014-12-02 DIAGNOSIS — Y92091 Bathroom in other non-institutional residence as the place of occurrence of the external cause: Secondary | ICD-10-CM | POA: Insufficient documentation

## 2014-12-02 DIAGNOSIS — F329 Major depressive disorder, single episode, unspecified: Secondary | ICD-10-CM | POA: Insufficient documentation

## 2014-12-02 HISTORY — DX: Major depressive disorder, single episode, unspecified: F32.9

## 2014-12-02 HISTORY — DX: Chronic obstructive pulmonary disease, unspecified: J44.9

## 2014-12-02 HISTORY — DX: Depression, unspecified: F32.A

## 2014-12-02 HISTORY — DX: Anxiety disorder, unspecified: F41.9

## 2014-12-02 LAB — SALICYLATE LEVEL

## 2014-12-02 LAB — COMPREHENSIVE METABOLIC PANEL
ALBUMIN: 3.2 g/dL — AB (ref 3.5–5.2)
ALK PHOS: 96 U/L (ref 39–117)
ALT: 16 U/L (ref 0–53)
ANION GAP: 4 — AB (ref 5–15)
AST: 20 U/L (ref 0–37)
BUN: 15 mg/dL (ref 6–23)
CHLORIDE: 106 meq/L (ref 96–112)
CO2: 24 mmol/L (ref 19–32)
Calcium: 7.9 mg/dL — ABNORMAL LOW (ref 8.4–10.5)
Creatinine, Ser: 0.79 mg/dL (ref 0.50–1.35)
Glucose, Bld: 106 mg/dL — ABNORMAL HIGH (ref 70–99)
Potassium: 4 mmol/L (ref 3.5–5.1)
Sodium: 134 mmol/L — ABNORMAL LOW (ref 135–145)
TOTAL PROTEIN: 6.2 g/dL (ref 6.0–8.3)
Total Bilirubin: 0.9 mg/dL (ref 0.3–1.2)

## 2014-12-02 LAB — RAPID URINE DRUG SCREEN, HOSP PERFORMED
Amphetamines: NOT DETECTED
BARBITURATES: NOT DETECTED
BENZODIAZEPINES: NOT DETECTED
Cocaine: NOT DETECTED
Opiates: NOT DETECTED
Tetrahydrocannabinol: NOT DETECTED

## 2014-12-02 LAB — CBC WITH DIFFERENTIAL/PLATELET
BASOS ABS: 0 10*3/uL (ref 0.0–0.1)
BASOS PCT: 0 % (ref 0–1)
EOS ABS: 0.4 10*3/uL (ref 0.0–0.7)
EOS PCT: 4 % (ref 0–5)
HEMATOCRIT: 37.3 % — AB (ref 39.0–52.0)
Hemoglobin: 12.5 g/dL — ABNORMAL LOW (ref 13.0–17.0)
LYMPHS PCT: 20 % (ref 12–46)
Lymphs Abs: 1.8 10*3/uL (ref 0.7–4.0)
MCH: 29.7 pg (ref 26.0–34.0)
MCHC: 33.5 g/dL (ref 30.0–36.0)
MCV: 88.6 fL (ref 78.0–100.0)
MONOS PCT: 15 % — AB (ref 3–12)
Monocytes Absolute: 1.4 10*3/uL — ABNORMAL HIGH (ref 0.1–1.0)
NEUTROS ABS: 5.5 10*3/uL (ref 1.7–7.7)
Neutrophils Relative %: 61 % (ref 43–77)
PLATELETS: 223 10*3/uL (ref 150–400)
RBC: 4.21 MIL/uL — AB (ref 4.22–5.81)
RDW: 13 % (ref 11.5–15.5)
WBC: 9 10*3/uL (ref 4.0–10.5)

## 2014-12-02 LAB — CBG MONITORING, ED: Glucose-Capillary: 85 mg/dL (ref 70–99)

## 2014-12-02 LAB — TROPONIN I: Troponin I: 0.03 ng/mL (ref <0.031)

## 2014-12-02 LAB — ACETAMINOPHEN LEVEL: Acetaminophen (Tylenol), Serum: <10 ug/mL — ABNORMAL LOW (ref 10–30)

## 2014-12-02 LAB — ETHANOL

## 2014-12-02 MED ORDER — ZOLPIDEM TARTRATE 5 MG PO TABS: 5.0000 mg | ORAL_TABLET | Freq: Every evening | ORAL | Status: DC | PRN

## 2014-12-02 MED ORDER — SODIUM CHLORIDE 0.9 % IV BOLUS (SEPSIS)
1000.0000 mL | Freq: Once | INTRAVENOUS | Status: AC
  Administered 2014-12-02: 1000 mL via INTRAVENOUS

## 2014-12-02 MED ORDER — ACETAMINOPHEN 325 MG PO TABS
650.0000 mg | ORAL_TABLET | ORAL | Status: DC | PRN
  Filled 2014-12-02: qty 2

## 2014-12-02 MED ORDER — IBUPROFEN 200 MG PO TABS
600.0000 mg | ORAL_TABLET | Freq: Three times a day (TID) | ORAL | Status: DC | PRN
  Administered 2014-12-02 – 2014-12-03 (×3): 600 mg via ORAL
  Filled 2014-12-02 (×3): qty 3

## 2014-12-02 NOTE — ED Notes (Signed)
Patient belongings are in 1 patient bag. Patient has red plaid jacket, grey top and black pants, socks and tennis shoes. Patient has glasses on his person. Patient has been wanded by security. Belongings are at nursing station by room 25.

## 2014-12-02 NOTE — ED Notes (Signed)
Patient belongings placed in locker number 29.

## 2014-12-02 NOTE — ED Notes (Signed)
Pt states "passing out" x several days. Has not sought treatment.  Has no primary MD.  Has facial abrasion noted.  Pt states chest pain post falls.  ? Rib injury.  No fever.  No n/v.  No hx of same

## 2014-12-02 NOTE — ED Notes (Signed)
Patients ex-wife [name] states she can be contacted if needed cell [phone] and [phone]

## 2014-12-02 NOTE — ED Provider Notes (Signed)
CSN: [id number]     Arrival date & time 12/02/14  1443 History   First MD Initiated Contact with Patient 12/02/14 1527     Chief Complaint  Patient presents with  . Near Syncope     (Consider location/radiation/quality/duration/timing/severity/associated sxs/prior Treatment) HPI  64 year old male presents with syncope and diffuse fatigue over the last 4 days. 4 days ago he passed out while in the bathroom while walking. He hit his face on some object and his daughter remarks that there was blood all over the floor. The patient did not remember any preceding symptoms. The patient tried to get up multiple times was too weak. Eventually he was able to get up and rest. He's been generally fatigued for "a while" but it seems to be worse now. Is not eating or drinking well, states he is just not hungry. This seems to be possibly related to his depression per family. Patient has had chest wall pain on the left side since falling over his ribs. He also hit his face is having proximal nasal pain. Patient denies any headache, blurry vision, focal weakness, numbness, or abdominal pain.   Past Medical History  Diagnosis Date  . Depression   . Anxiety   . COPD (chronic obstructive pulmonary disease)    Past Surgical History  Procedure Laterality Date  . Replacement total knee    . Nasal septum surgery     History reviewed. No pertinent family history. History  Substance Use Topics  . Smoking status: Former Games developermoker  . Smokeless tobacco: Not on file  . Alcohol Use: No    Review of Systems  Constitutional: Positive for fatigue.  Respiratory: Negative for shortness of breath.   Cardiovascular: Positive for chest pain.  Gastrointestinal: Negative for vomiting, abdominal pain and diarrhea.  Genitourinary: Negative for dysuria.  Neurological: Positive for weakness. Negative for numbness and headaches.      Allergies  Review of patient's allergies indicates no known allergies.  Home  Medications   Prior to Admission medications   Not on File   BP 144/84 mmHg  Pulse 74  Temp(Src) 98.2 F (36.8 C) (Oral)  Resp 20  SpO2 99% Physical Exam  Constitutional: He is oriented to person, place, and time. He appears well-developed and well-nourished.  HENT:  Head: Normocephalic.    Right Ear: External ear normal.  Left Ear: External ear normal.  Nose: Nose lacerations and sinus tenderness present. No nasal deformity or septal deviation.    Eyes: EOM are normal. Pupils are equal, round, and reactive to light. Right eye exhibits no discharge. Left eye exhibits no discharge.  Neck: Neck supple.  Cardiovascular: Normal rate, regular rhythm, normal heart sounds and intact distal pulses.   Pulmonary/Chest: Effort normal and breath sounds normal. He exhibits tenderness (lateral chest wall over inferior ribs. no ecchymosis).  Abdominal: Soft. He exhibits no distension. There is no tenderness.  Musculoskeletal: He exhibits no edema.  Neurological: He is alert and oriented to person, place, and time.  CN 2-12 grossly intact. 5/5 strength in all 4 extremities  Skin: Skin is warm and dry.  Nursing note and vitals reviewed.   ED Course  Procedures (including critical care time) Labs Review Labs Reviewed  CBC WITH DIFFERENTIAL - Abnormal; Notable for the following:    RBC 4.21 (*)    Hemoglobin 12.5 (*)    HCT 37.3 (*)    Monocytes Relative 15 (*)    Monocytes Absolute 1.4 (*)    All other components within  normal limits  COMPREHENSIVE METABOLIC PANEL - Abnormal; Notable for the following:    Sodium 134 (*)    Glucose, Bld 106 (*)    Calcium 7.9 (*)    Albumin 3.2 (*)    Anion gap 4 (*)    All other components within normal limits  TROPONIN I  URINE RAPID DRUG SCREEN (HOSP PERFORMED)  ETHANOL  SALICYLATE LEVEL  ACETAMINOPHEN LEVEL    Imaging Review Dg Nasal Bones  12/02/2014   CLINICAL DATA:  Initial evaluation forFall today; pt states that he passed out and  fell at his house today; pain in left chest due to fall, no known cardiopulmonary problems per pt; ex smoker, quit 3 years ago; pain in nose, pt was unable to pinpoint the site of pain; scab was seen on anterior aspect of nose, more towards superior end; no previous injury on nose  EXAM: NASAL BONES - 3+ VIEW  COMPARISON:  None.  FINDINGS: There are a few nonspecific chronic appearing soft tissue calcifications involving the nose. No evidence of nasal bone fracture.  IMPRESSION: No acute finding   Electronically Signed   By: Esperanza Heir M.D.   On: 12/02/2014 16:45   Dg Chest 2 View  12/02/2014   CLINICAL DATA:  64 year old male with history of trauma from a fall today complaining of pain in the left side of the chest.  EXAM: CHEST  2 VIEW  COMPARISON:  Chest x-ray 11/06/2009.  FINDINGS: Elevation of left hemidiaphragm. Linear opacity in the left lower lobe, which may reflect an area of atelectasis or scarring. No definite acute consolidative airspace disease. Moderate left pleural effusion. Innumerable calcified granulomas throughout the lungs bilaterally (right greater than left). Bullous changes in the lung apices (right greater than left). Heart size is within normal limits. The patient is rotated to the left on today's exam, resulting in distortion of the mediastinal contours and reduced diagnostic sensitivity and specificity for mediastinal pathology. Atherosclerosis in the thoracic aorta. Bony thorax appears grossly intact.  IMPRESSION: 1. Moderate left-sided pleural effusion. 2. Elevation of left hemidiaphragm. 3. Linear scarring or subsegmental atelectasis in the left lower lobe. 4. Atherosclerosis. 5. Old granulomatous disease. 6. Emphysema.   Electronically Signed   By: Trudie Reed M.D.   On: 12/02/2014 16:46     EKG Interpretation   Date/Time:  Sunday December 02 2014 14:52:51 EST Ventricular Rate:  77 PR Interval:  146 QRS Duration: 88 QT Interval:  396 QTC Calculation: 448 R Axis:    43 Text Interpretation:  Normal sinus rhythm Probable left atrial enlargement  Anterior infarct, old Borderline ST elevation, likely early repol No  significant change since 2009 Confirmed by Eleanore Junio  MD, Sarye Kath ([id number]) on  12/02/2014 3:29:43 PM      MDM   Final diagnoses:  Syncope and collapse  Depression  Pleural effusion, left    Patient's workup is unremarkable except for a pleural effusion seen on the left on x-ray. Patient denies any dyspnea and there is no evidence of rib fractures on x-ray. Patient has no increased work of breathing. I do not feel this is symptomatic necessarily. Given is not hypoxic or in any respiratory distress I believe this can be followed as an outpatient with pulmonology. She does endorse that he is quite depressed and feels passive suicidal thoughts. Given this, psych has been consulted    Audree Camel, MD 12/02/14 2333

## 2014-12-02 NOTE — ED Notes (Addendum)
Pt reports increased difficulty taking care of himself recently d/t major depression and anxiety. Pt reports frequent thoughts of hurting himself, hx of suicide attempts. Pt lives by himself. Pt reports several days ago he had 4-5 unwitnessed syncopal episodes, unsure of how long he lost consciousness for. Pt has abrasions to nose and to legs, no active bleeding at this time. Pt denies N/V/D. No dizziness at this time, unsure if he was feeling dizzy the day of the syncopal episodes. Pt denies weakness. A&O x 4. Pt's ex-wife is at bedside.

## 2014-12-02 NOTE — ED Notes (Signed)
Pt ate 100% of his dinner   
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
## 2014-12-02 NOTE — ED Notes (Signed)
Pt in radiology at this time. 

## 2014-12-03 DIAGNOSIS — F329 Major depressive disorder, single episode, unspecified: Secondary | ICD-10-CM

## 2014-12-03 DIAGNOSIS — F332 Major depressive disorder, recurrent severe without psychotic features: Secondary | ICD-10-CM | POA: Insufficient documentation

## 2014-12-03 DIAGNOSIS — R55 Syncope and collapse: Secondary | ICD-10-CM | POA: Insufficient documentation

## 2014-12-03 DIAGNOSIS — F32A Depression, unspecified: Secondary | ICD-10-CM | POA: Insufficient documentation

## 2014-12-03 DIAGNOSIS — R45851 Suicidal ideations: Secondary | ICD-10-CM | POA: Diagnosis not present

## 2014-12-03 LAB — URINALYSIS, ROUTINE W REFLEX MICROSCOPIC
Bilirubin Urine: NEGATIVE
Glucose, UA: NEGATIVE mg/dL
Hgb urine dipstick: NEGATIVE
KETONES UR: NEGATIVE mg/dL
Leukocytes, UA: NEGATIVE
NITRITE: NEGATIVE
Protein, ur: NEGATIVE mg/dL
SPECIFIC GRAVITY, URINE: 1.015 (ref 1.005–1.030)
Urobilinogen, UA: 0.2 mg/dL (ref 0.0–1.0)
pH: 5 (ref 5.0–8.0)

## 2014-12-03 MED ORDER — FLUOXETINE HCL 20 MG PO CAPS
20.0000 mg | ORAL_CAPSULE | Freq: Every day | ORAL | Status: DC
  Administered 2014-12-03: 20 mg via ORAL
  Filled 2014-12-03: qty 1

## 2014-12-03 MED ORDER — TRAZODONE HCL 100 MG PO TABS: 100.0000 mg | ORAL_TABLET | Freq: Every day | ORAL | Status: DC

## 2014-12-03 NOTE — ED Notes (Signed)
Pt is slow processing when answering questions from Clinical research associatewriter. He has a flat/blunted affect with poor hygiene. Pt rates depression as a 6 on 1-10 scale. He denies hopeless feeling or si thoughts at this time. Pt denies hi thoughts. Pt reports that he has no support system of friends. He has a son that lives in [place]. Offered support, encouragement and prn medication for pain. Safety maintained on the unit.

## 2014-12-03 NOTE — ED Notes (Signed)
Patient eating a sandwich and drinking juice.  

## 2014-12-03 NOTE — ED Provider Notes (Signed)
16:00- evaluation for medical clearance, specifically for evaluation of blood pressure.  Patient was seen and evaluated by Dr. Criss AlvineGoldston, yesterday, medically cleared and prepared for transfer to do [facility], for treatment of his depression.  Patient states that he has been on antihypertensives in the past but cannot remember what it was, who prescribed it.   Medications  acetaminophen (TYLENOL) tablet 650 mg (not administered)  ibuprofen (ADVIL,MOTRIN) tablet 600 mg (600 mg Oral Given 12/03/14 0751)  FLUoxetine (PROZAC) capsule 20 mg (20 mg Oral Given 12/03/14 1333)  traZODone (DESYREL) tablet 100 mg (not administered)  sodium chloride 0.9 % bolus 1,000 mL (0 mLs Intravenous Stopped 12/02/14 1930)    Patient Vitals for the past 24 hrs:  BP Temp Temp src Pulse Resp SpO2  12/03/14 1409 180/69 mmHg 97.8 F (36.6 C) Oral 70 18 96 %  12/03/14 1147 176/77 mmHg 97.7 F (36.5 C) Oral 73 18 98 %  12/03/14 0638 94/61 mmHg 98 F (36.7 C) Oral 77 16 98 %  12/02/14 2130 164/89 mmHg 98.3 F (36.8 C) Oral 79 18 98 %  12/02/14 1801 185/83 mmHg 98.1 F (36.7 C) Oral 61 20 99 %  12/02/14 1630 - - - 66 21 100 %  12/02/14 1615 182/74 mmHg - - 66 18 100 %    Assessment: psychiatric illness, requiring treatment and admission to a psychiatric facility.  Incidental hypertension, which is mildly symptomatic.  There is no evidence for hypertensive urgency, emergency, CVA or indication for lowering of blood pressure acutely in the emergency department setting.  While here, his blood pressure has been somewhat labile.  However, there have been no severely elevated blood pressures.  Review of prior blood pressures, going back for several months; reveal mild elevations of BP in the past, but no significant persistent high blood pressures.  Patient is stable for admission to a psychiatric facility, with observation of his blood pressure, and follow-up evaluation of same, expectantly, and not acutely.  This type of blood  pressure elevation would be evaluated in the community, within one or 2 weeks time      Flint MelterElliott L Shyne Resch, MD 12/03/14 [phone]

## 2014-12-03 NOTE — Consult Note (Addendum)
[facility] Face-to-Face Psychiatry Consult   Reason for Consult:  Depression Referring Physician: EDP [name] is an 64 y.o. male. Total Time spent with patient: 1 hour  Assessment: DSM5 Major depressive disorder, recurrent, severe   Past Medical History  Diagnosis Date  . Depression   . Anxiety   . COPD (chronic obstructive pulmonary disease)    Plan:  Recommend psychiatric Inpatient admission when medically cleared. Patient is already accepted at [name].  Subjective:   [name] is a 64 y.o. male patient admitted with Major depression.  HPI:  Caucasian male, 64 years old was seen for major depression who was brought in by his ex-wife for not taking care of his needs.  Ex-wife was worried that this behavior is a planned suicide attempt.  This morning during rounds patient stated that he has been overwhelmed with life and that he has been having fainting spells and has passed out several times.  He reported poor sleep and appetite.  Patient reported that he used to take Prozac and Clonazepam in the past and stopped taking any MH medications when [name] refused to renew his Clonazepam.  Patient reported that he stopped taking all of his medications entirely.  He denied HI/AVH but could not contract for safety.  He has been accepted at [name] and is waiting for transportation.  HPI Elements:   Location:  Major depression recurrent, severe. Quality:  Insomnia, feelings of hopelessness, helplessness, insomnia. Severity:  severe. Timing:  ongoing. Duration:  Chronic mental illness. Context:  Seeking treatment for depression.  Past Psychiatric History: Past Medical History  Diagnosis Date  . Depression   . Anxiety   . COPD (chronic obstructive pulmonary disease)     reports that he has quit smoking. He does not have any smokeless tobacco history on file. He reports that he does not drink alcohol or use illicit  drugs. History reviewed. No pertinent family history. Family History Substance Abuse: No Family Supports: Yes, List: (ex-wife) Living Arrangements: Alone Can pt return to current living arrangement?: Yes   Allergies:  No Known Allergies  ACT Assessment Complete:  Yes:    Educational Status    Risk to Self: Risk to self with the past 6 months Suicidal Ideation: Yes-Currently Present Suicidal Intent: No Is patient at risk for suicide?: Yes Suicidal Plan?: No-Not Currently/Within Last 6 Months What has been your use of drugs/alcohol within the last 12 months?: none Previous Attempts/Gestures: Yes How many times?: 2 Triggers for Past Attempts: Unpredictable, Spouse contact Intentional Self Injurious Behavior: None Family Suicide History: No Recent stressful life event(s): Loss (Comment), Other (Comment) (Increase of depression) Persecutory voices/beliefs?: No Depression: Yes Depression Symptoms: Despondent, Insomnia, Isolating, Fatigue, Guilt, Loss of interest in usual pleasures, Feeling worthless/self pity, Feeling angry/irritable (hopelessness) Substance abuse history and/or treatment for substance abuse?: No  Risk to Others: Risk to Others within the past 6 months Homicidal Ideation: No-Not Currently/Within Last 6 Months Thoughts of Harm to Others: No-Not Currently Present/Within Last 6 Months Current Homicidal Intent: No-Not Currently/Within Last 6 Months Current Homicidal Plan: No-Not Currently/Within Last 6 Months Access to Homicidal Means: No History of harm to others?: No Assessment of Violence: None Noted Does patient have access to weapons?: No Criminal Charges Pending?: No  Abuse:    Prior Inpatient Therapy: Prior Inpatient Therapy Prior Inpatient Therapy: Yes Prior Therapy Dates: [name] Prior Therapy Facilty/Provider(s): 2006, 2006 Reason for Treatment: SI  Prior Outpatient Therapy: Prior Outpatient Therapy Prior Outpatient  Therapy: Yes Prior Therapy  Facilty/Provider(s): [name] Reason for Treatment: Depressiom  Additional Information: Additional Information 1:1 In Past 12 Months?: No CIRT Risk: No Elopement Risk: No Does patient have medical clearance?: Yes    Objective: Blood pressure 176/77, pulse 73, temperature 97.7 F (36.5 C), temperature source Oral, resp. rate 18, SpO2 98 %.There is no weight on file to calculate BMI. Results for orders placed or performed during the hospital encounter of 12/02/14 (from the past 72 hour(s))  CBC with Differential     Status: Abnormal   Collection Time: 12/02/14  3:14 PM  Result Value Ref Range   WBC 9.0 4.0 - 10.5 K/uL   RBC 4.21 (L) 4.22 - 5.81 MIL/uL   Hemoglobin 12.5 (L) 13.0 - 17.0 g/dL   HCT 37.3 (L) 39.0 - 52.0 %   MCV 88.6 78.0 - 100.0 fL   MCH 29.7 26.0 - 34.0 pg   MCHC 33.5 30.0 - 36.0 g/dL   RDW 13.0 11.5 - 15.5 %   Platelets 223 150 - 400 K/uL   Neutrophils Relative % 61 43 - 77 %   Neutro Abs 5.5 1.7 - 7.7 K/uL   Lymphocytes Relative 20 12 - 46 %   Lymphs Abs 1.8 0.7 - 4.0 K/uL   Monocytes Relative 15 (H) 3 - 12 %   Monocytes Absolute 1.4 (H) 0.1 - 1.0 K/uL   Eosinophils Relative 4 0 - 5 %   Eosinophils Absolute 0.4 0.0 - 0.7 K/uL   Basophils Relative 0 0 - 1 %   Basophils Absolute 0.0 0.0 - 0.1 K/uL  Comprehensive metabolic panel     Status: Abnormal   Collection Time: 12/02/14  3:14 PM  Result Value Ref Range   Sodium 134 (L) 135 - 145 mmol/L    Comment: Please note change in reference range.   Potassium 4.0 3.5 - 5.1 mmol/L    Comment: Please note change in reference range.   Chloride 106 96 - 112 mEq/L   CO2 24 19 - 32 mmol/L   Glucose, Bld 106 (H) 70 - 99 mg/dL   BUN 15 6 - 23 mg/dL   Creatinine, Ser 0.79 0.50 - 1.35 mg/dL   Calcium 7.9 (L) 8.4 - 10.5 mg/dL   Total Protein 6.2 6.0 - 8.3 g/dL   Albumin 3.2 (L) 3.5 - 5.2 g/dL   AST 20 0 - 37 U/L   ALT 16 0 - 53 U/L   Alkaline Phosphatase 96 39 - 117 U/L   Total Bilirubin 0.9 0.3 - 1.2 mg/dL   GFR calc  non Af Amer >90 >90 mL/min   GFR calc Af Amer >90 >90 mL/min    Comment: (NOTE) The eGFR has been calculated using the CKD EPI equation. This calculation has not been validated in all clinical situations. eGFR's persistently <90 mL/min signify possible Chronic Kidney Disease.    Anion gap 4 (L) 5 - 15  Troponin I     Status: None   Collection Time: 12/02/14  3:14 PM  Result Value Ref Range   Troponin I 0.03 <0.031 ng/mL    Comment:        NO INDICATION OF MYOCARDIAL INJURY. Please note change in reference range.   CBG monitoring, ED     Status: None   Collection Time: 12/02/14  3:33 PM  Result Value Ref Range   Glucose-Capillary 85 70 - 99 mg/dL  Ethanol     Status: None   Collection Time: 12/02/14  4:49 PM  Result Value Ref Range   Alcohol, Ethyl (B) <5 0 - 9 mg/dL    Comment:        LOWEST DETECTABLE LIMIT FOR SERUM ALCOHOL IS 11 mg/dL FOR MEDICAL PURPOSES ONLY   Salicylate level     Status: None   Collection Time: 12/02/14  4:49 PM  Result Value Ref Range   Salicylate Lvl <4.1 2.8 - 20.0 mg/dL  Acetaminophen level     Status: Abnormal   Collection Time: 12/02/14  4:49 PM  Result Value Ref Range   Acetaminophen (Tylenol), Serum <10.0 (L) 10 - 30 ug/mL    Comment:        THERAPEUTIC CONCENTRATIONS VARY SIGNIFICANTLY. A RANGE OF 10-30 ug/mL MAY BE AN EFFECTIVE CONCENTRATION FOR MANY PATIENTS. HOWEVER, SOME ARE BEST TREATED AT CONCENTRATIONS OUTSIDE THIS RANGE. ACETAMINOPHEN CONCENTRATIONS >150 ug/mL AT 4 HOURS AFTER INGESTION AND >50 ug/mL AT 12 HOURS AFTER INGESTION ARE OFTEN ASSOCIATED WITH TOXIC REACTIONS.   Urine rapid drug screen (hosp performed)     Status: None   Collection Time: 12/02/14  5:00 PM  Result Value Ref Range   Opiates NONE DETECTED NONE DETECTED   Cocaine NONE DETECTED NONE DETECTED   Benzodiazepines NONE DETECTED NONE DETECTED   Amphetamines NONE DETECTED NONE DETECTED   Tetrahydrocannabinol NONE DETECTED NONE DETECTED   Barbiturates  NONE DETECTED NONE DETECTED    Comment:        DRUG SCREEN FOR MEDICAL PURPOSES ONLY.  IF CONFIRMATION IS NEEDED FOR ANY PURPOSE, NOTIFY LAB WITHIN 5 DAYS.        LOWEST DETECTABLE LIMITS FOR URINE DRUG SCREEN Drug Class       Cutoff (ng/mL) Amphetamine      1000 Barbiturate      200 Benzodiazepine   030 Tricyclics       131 Opiates          300 Cocaine          300 THC              50   Urinalysis, Routine w reflex microscopic     Status: None   Collection Time: 12/03/14  3:02 AM  Result Value Ref Range   Color, Urine YELLOW YELLOW   APPearance CLEAR CLEAR   Specific Gravity, Urine 1.015 1.005 - 1.030   pH 5.0 5.0 - 8.0   Glucose, UA NEGATIVE NEGATIVE mg/dL   Hgb urine dipstick NEGATIVE NEGATIVE   Bilirubin Urine NEGATIVE NEGATIVE   Ketones, ur NEGATIVE NEGATIVE mg/dL   Protein, ur NEGATIVE NEGATIVE mg/dL   Urobilinogen, UA 0.2 0.0 - 1.0 mg/dL   Nitrite NEGATIVE NEGATIVE   Leukocytes, UA NEGATIVE NEGATIVE    Comment: MICROSCOPIC NOT DONE ON URINES WITH NEGATIVE PROTEIN, BLOOD, LEUKOCYTES, NITRITE, OR GLUCOSE <1000 mg/dL.   Labs are reviewed and are pertinent for As reported above.  Current Facility-Administered Medications  Medication Dose Route Frequency Provider Last Rate Last Dose  . acetaminophen (TYLENOL) tablet 650 mg  650 mg Oral Q4H PRN Ephraim Hamburger, MD      . FLUoxetine (PROZAC) capsule 20 mg  20 mg Oral Daily Shatarra Wehling      . ibuprofen (ADVIL,MOTRIN) tablet 600 mg  600 mg Oral Q8H PRN Ephraim Hamburger, MD   600 mg at 12/03/14 0751  . traZODone (DESYREL) tablet 100 mg  100 mg Oral QHS Bruin Bolger       No current outpatient prescriptions on file.    Psychiatric Specialty Exam:  Blood pressure 176/77, pulse 73, temperature 97.7 F (36.5 C), temperature source Oral, resp. rate 18, SpO2 98 %.There is no weight on file to calculate BMI.  General Appearance: Casual and Disheveled  Eye Contact::  Fair  Speech:  Clear and Coherent and Normal  Rate  Volume:  Normal  Mood:  Depressed, Dysphoric, Hopeless and helplesss  Affect:  Congruent, Depressed and Flat  Thought Process:  Intact and Linear  Orientation:  Full (Time, Place, and Person)  Thought Content:  WDL  Suicidal Thoughts:  Yes.  without intent/plan  Homicidal Thoughts:  No  Memory:  Immediate;   Poor Recent;   Poor Remote;   Poor  Judgement:  Poor  Insight:  Fair  Psychomotor Activity:  Normal  Concentration:  Fair  Recall:  [employer] of Knowledge:Fair  Language: Good  Akathisia:  NA  Handed:  Right  AIMS (if indicated):     Assets:  Desire for Improvement  Sleep:      Musculoskeletal: Strength & Muscle Tone: seen sitting in bed Gait & Station: assessment while sitting in bed Patient leans: seen sitting in bed  Treatment Plan Summary: Daily contact with patient to assess and evaluate symptoms and progress in treatment Medication management Accepted at [facility].  Waiting for transportation  Delfin Gant   PMHNP-BC 12/03/2014 12:02 PM  Patient seen, evaluated and I agree with notes by Nurse Practitioner. Corena Pilgrim, MD

## 2014-12-03 NOTE — BH Assessment (Signed)
[name], AC at [facility], confirms adult unit is currently at capacity. Contacted the following facilities for placement:  BED AVAILABLE, FAXED CLINICAL INFORMATION: [facility], per [name], per [name], per [name]  AT CAPACITY: [facility], per [name], per [name], per [name], per [name], per [name], per [name], per [name], per [name], per [name], per [name], per [name], per [name], per [name], per [name]  NO RESPONSE: [facility]   [name], LPC, N[facility] Triage Specialist [phone]  

## 2014-12-03 NOTE — BHH Counselor (Signed)
Magistrate Dan HumphreysWalker confirmed receipt of IVC paperwork. IVC originals placed in IVC SAPPU log and copies placed in pt's chart.   Evette Cristalaroline Paige Jadarious Dobbins, [place] Assessment Counselor

## 2014-12-03 NOTE — ED Notes (Signed)
Dr Effie ShyWentz contacted about pt's blood pressure and [facility]. Pt is on MD list for evaluation.

## 2014-12-03 NOTE — ED Notes (Signed)
Contacted Dr Effie ShyWentz and reported blood pressure. He is to evaluate pt. No signs and symptoms of distress. Safety maintained.

## 2014-12-03 NOTE — ED Notes (Signed)
Was told by off going CN that [facility] but [facility] requires IVC for transport.  Spoke with EDP, Dr. Effie ShyWentz who advised that he will not be IVC'ing someone for transport purposes.  Talked with psych doctor, Dr. Jannifer FranklinAkintayo about how to proceed.  Dr. Jannifer FranklinAkintayo states that he will look into it.

## 2014-12-03 NOTE — BH Assessment (Signed)
Received [facility]an [facility] who said Dr. Hans EdenLeslie Bronner is willing to accept Pt but only if Pt is placed under involuntary commitment. Number for nursing report is [phone]. Attempted to contact ED physician multiple times without success. Notified Pt's RN of recommendation and will pass along information to day shift TTS.  Harlin RainFord Ellis Ria CommentWarrick Jr, LPC, [facility] Triage Specialist [phone]

## 2014-12-03 NOTE — ED Notes (Signed)
Call[facility] and [facility] voice mail [facility]

## 2016-09-23 IMAGING — CR DG NASAL BONES 3+V
3 series · 3 of 3 positions shown · non-contrast
Comparison: None.

CLINICAL DATA: Initial evaluation forFall today; pt states that he
passed out and fell at his house today; pain in left chest due to
fall, no known cardiopulmonary problems per pt; ex smoker, quit 3
years ago; pain in nose, pt was unable to pinpoint the site of pain;
scab was seen on anterior aspect of nose, more towards superior end;
no previous injury on nose

EXAM:
NASAL BONES - 3+ VIEW

[w waters pa]
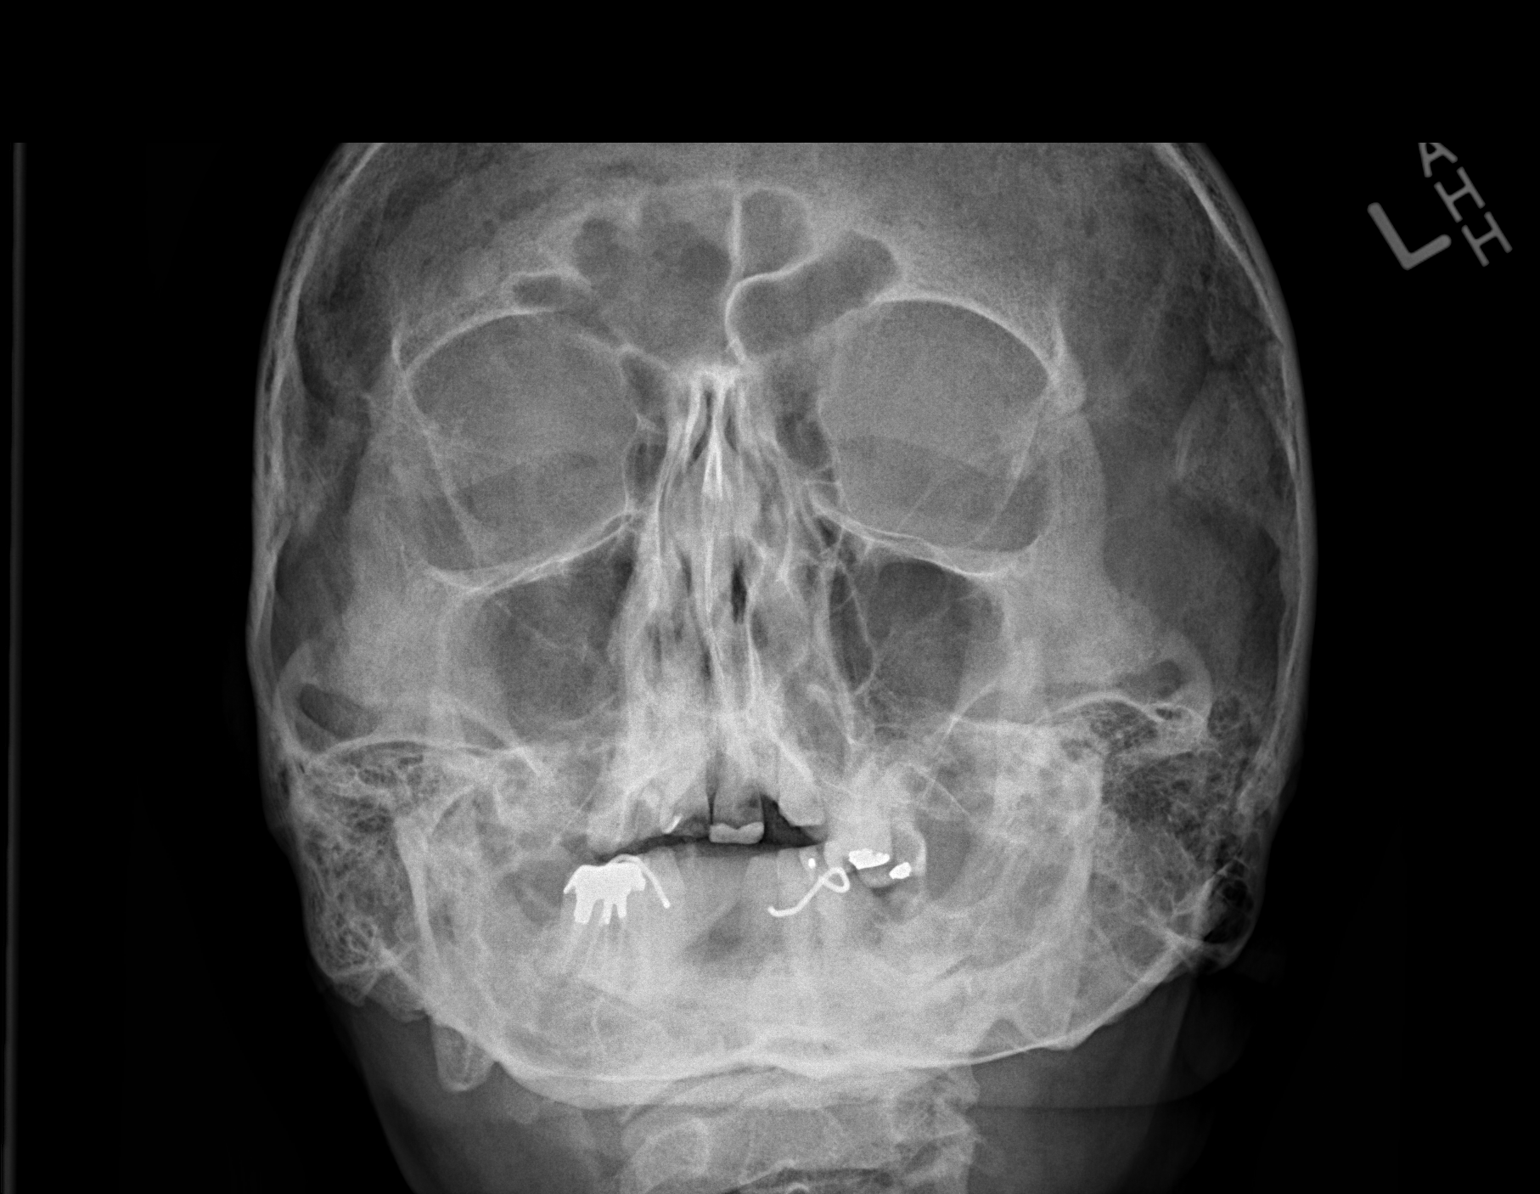

[w nasal bone lat (1 of 2)]
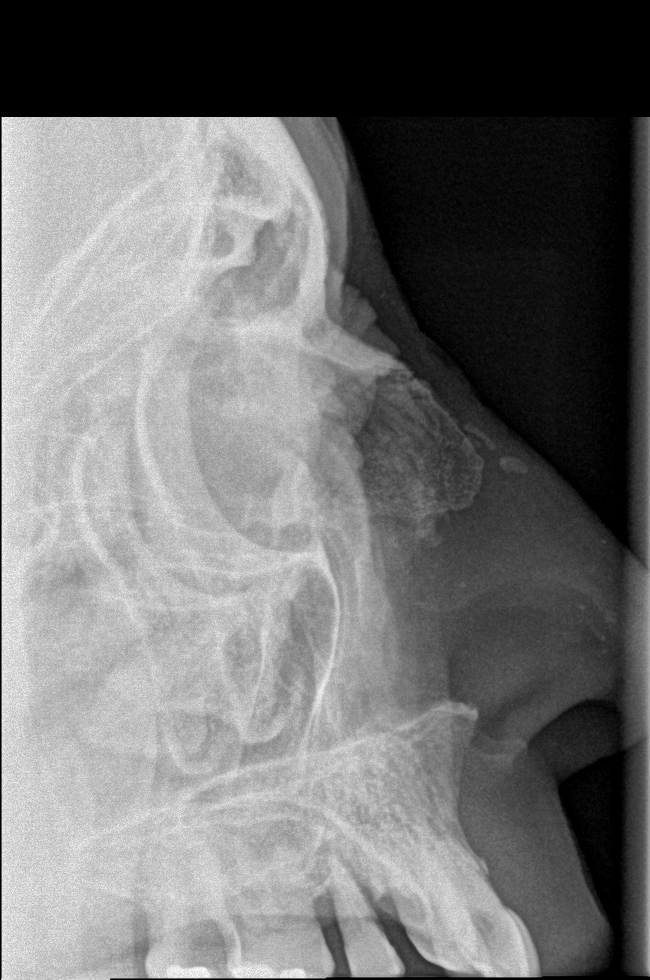

[w nasal bone lat (2 of 2)]
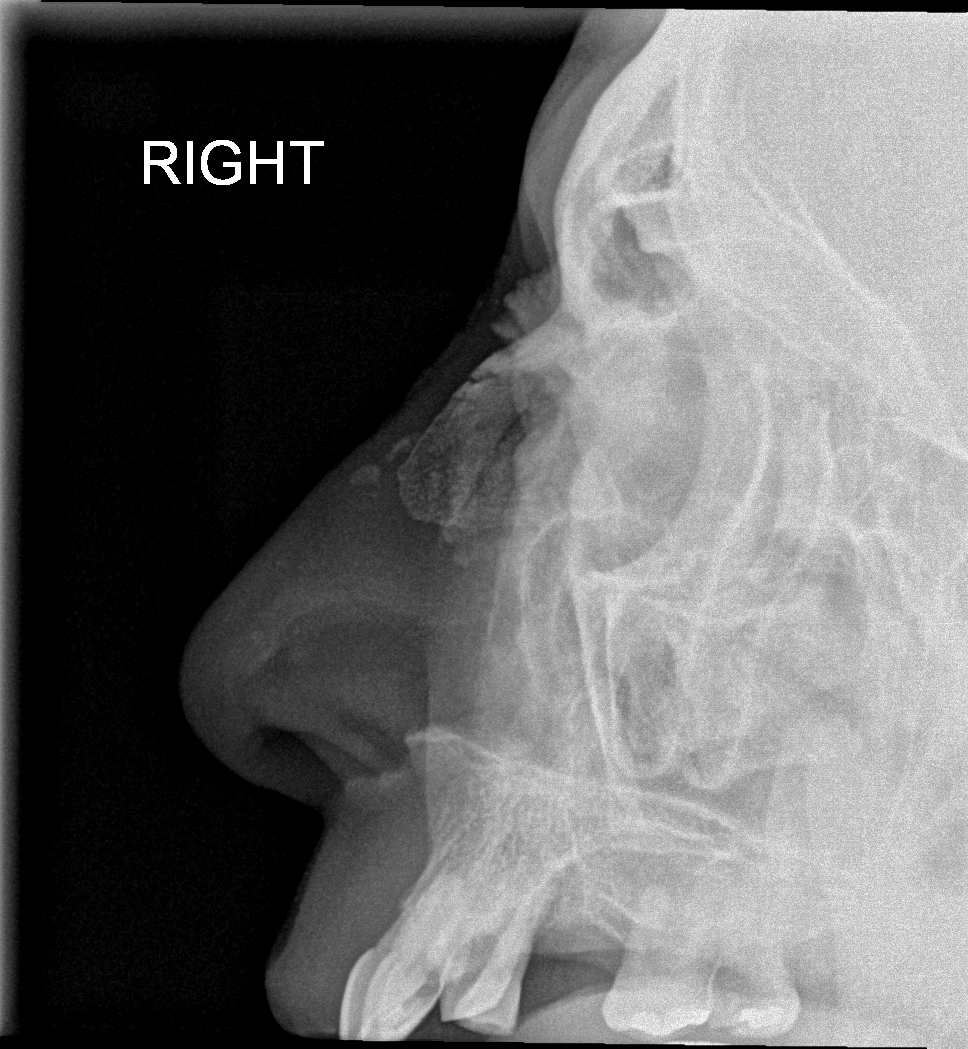

[3 of 3 positions shown; findings below may reference images not displayed]

FINDINGS: There are a few nonspecific chronic appearing soft tissue
calcifications involving the nose. No evidence of nasal bone
fracture.
IMPRESSION: No acute finding
